# Patient Record
Sex: Female | Born: 1980 | Race: Black or African American | Hispanic: No | Marital: Single | Smoking: Never smoker
Health system: Southern US, Community
[De-identification: ages and names within clinical notes are randomized; demographics above are authoritative.]

## PROBLEM LIST (undated history)

## (undated) ENCOUNTER — Inpatient Hospital Stay (HOSPITAL_COMMUNITY): Payer: Self-pay

## (undated) DIAGNOSIS — Z9049 Acquired absence of other specified parts of digestive tract: Secondary | ICD-10-CM

## (undated) HISTORY — DX: Acquired absence of other specified parts of digestive tract: Z90.49

## (undated) HISTORY — PX: APPENDECTOMY: SHX54

---

## 2017-07-30 ENCOUNTER — Emergency Department (HOSPITAL_COMMUNITY)
Admission: EM | Admit: 2017-07-30 | Discharge: 2017-07-30 | Disposition: A | Discharge status: Home / Self Care | Payer: Self-pay | Attending: Emergency Medicine | Admitting: Emergency Medicine

## 2017-07-30 ENCOUNTER — Other Ambulatory Visit: Payer: Self-pay

## 2017-07-30 ENCOUNTER — Emergency Department (HOSPITAL_COMMUNITY): Payer: Self-pay

## 2017-07-30 ENCOUNTER — Encounter (HOSPITAL_COMMUNITY): Payer: Self-pay

## 2017-07-30 DIAGNOSIS — O209 Hemorrhage in early pregnancy, unspecified: Secondary | ICD-10-CM | POA: Insufficient documentation

## 2017-07-30 DIAGNOSIS — O26891 Other specified pregnancy related conditions, first trimester: Secondary | ICD-10-CM | POA: Insufficient documentation

## 2017-07-30 DIAGNOSIS — N939 Abnormal uterine and vaginal bleeding, unspecified: Secondary | ICD-10-CM

## 2017-07-30 DIAGNOSIS — R109 Unspecified abdominal pain: Secondary | ICD-10-CM | POA: Insufficient documentation

## 2017-07-30 DIAGNOSIS — Z3490 Encounter for supervision of normal pregnancy, unspecified, unspecified trimester: Secondary | ICD-10-CM

## 2017-07-30 DIAGNOSIS — Z3A01 Less than 8 weeks gestation of pregnancy: Secondary | ICD-10-CM | POA: Insufficient documentation

## 2017-07-30 LAB — WET PREP, GENITAL
Clue Cells Wet Prep HPF POC: NONE SEEN
SPERM: NONE SEEN
Trich, Wet Prep: NONE SEEN
Yeast Wet Prep HPF POC: NONE SEEN

## 2017-07-30 LAB — COMPREHENSIVE METABOLIC PANEL
ALBUMIN: 4 g/dL (ref 3.5–5.0)
ALK PHOS: 52 U/L (ref 38–126)
ALT: 10 U/L — AB (ref 14–54)
ANION GAP: 6 (ref 5–15)
AST: 21 U/L (ref 15–41)
BUN: 8 mg/dL (ref 6–20)
CALCIUM: 9.5 mg/dL (ref 8.9–10.3)
CHLORIDE: 107 mmol/L (ref 101–111)
CO2: 23 mmol/L (ref 22–32)
CREATININE: 0.95 mg/dL (ref 0.44–1.00)
GFR calc non Af Amer: >60 mL/min (ref >60)
GLUCOSE: 94 mg/dL (ref 65–99)
Potassium: 4 mmol/L (ref 3.5–5.1)
SODIUM: 136 mmol/L (ref 135–145)
Total Bilirubin: 1.4 mg/dL — ABNORMAL HIGH (ref 0.3–1.2)
Total Protein: 7.3 g/dL (ref 6.5–8.1)

## 2017-07-30 LAB — TYPE AND SCREEN
ABO/RH(D): O POS
ANTIBODY SCREEN: NEGATIVE

## 2017-07-30 LAB — CBC
HCT: 36.5 % (ref 36.0–46.0)
Hemoglobin: 12.1 g/dL (ref 12.0–15.0)
MCH: 25.1 pg — ABNORMAL LOW (ref 26.0–34.0)
MCHC: 33.2 g/dL (ref 30.0–36.0)
MCV: 75.6 fL — ABNORMAL LOW (ref 78.0–100.0)
PLATELETS: 189 10*3/uL (ref 150–400)
RBC: 4.83 MIL/uL (ref 3.87–5.11)
RDW: 16.5 % — ABNORMAL HIGH (ref 11.5–15.5)
WBC: 4.1 10*3/uL (ref 4.0–10.5)

## 2017-07-30 LAB — I-STAT BETA HCG BLOOD, ED (MC, WL, AP ONLY)

## 2017-07-30 LAB — LIPASE, BLOOD: LIPASE: 21 U/L (ref 11–51)

## 2017-07-30 LAB — HCG, QUANTITATIVE, PREGNANCY: hCG, Beta Chain, Quant, S: 9409 m[IU]/mL — ABNORMAL HIGH (ref <5)

## 2017-07-30 NOTE — ED Notes (Signed)
Pt back from US

## 2017-07-30 NOTE — ED Notes (Signed)
Patient transported to Ultrasound 

## 2017-07-30 NOTE — ED Provider Notes (Signed)
[facility] Provider Note   CSN: [id number] Arrival date & time: 07/30/17  1140     History   Chief Complaint Chief Complaint  Patient presents with  . Abdominal Pain    HPI Amber Krause is a 36 y.o. female.  36 year old female presents with complaint of left flank discomfort and mild vaginal spotting.  She reports 2 prior pregnancies (both live births at term).  She reports a prior appendectomy.  She reports that her symptoms started over the last 2 days.  Her reported last menstrual period was at the end of October. She denies prior ectopic or STD. No prior reported miscarriage.  She denies urinary changes or BM changes.  She appears comfortable during evaluation.    The history is provided by the patient.  Vaginal Bleeding  Primary symptoms include vaginal bleeding. There has been no fever. This is a new problem. The current episode started 2 days ago. The problem occurs rarely. The problem has not changed since onset.She is pregnant. She has missed her period. Her LMP was months ago. The patient's menstrual history has been irregular. The discharge was normal. Associated symptoms include abdominal pain. The treatment provided no relief. Sexual activity: sexually active.    History reviewed. No pertinent past medical history.  There are no active problems to display for this patient.   History reviewed. No pertinent surgical history.  OB History    No data available       Home Medications    Prior to Admission medications   Not on File    Family History History reviewed. No pertinent family history.  Social History Social History   Tobacco Use  . Smoking status: Never Smoker  . Smokeless tobacco: Never Used  Substance Use Topics  . Alcohol use: No    Frequency: Never  . Drug use: Not on file     Allergies   Patient has no known allergies.   Review of Systems Review of Systems  Gastrointestinal:  Positive for abdominal pain.  Genitourinary: Positive for vaginal bleeding.  All other systems reviewed and are negative.    Physical Exam Updated Vital Signs BP 114/68 (BP Location: Right Arm)   Pulse 64   Temp 97.9 F (36.6 C) (Oral)   Resp 20   LMP 05/30/2017 (Within Days)   SpO2 100%   Physical Exam  Constitutional: She is oriented to person, place, and time. She appears well-developed and well-nourished. No distress.  HENT:  Head: Normocephalic and atraumatic.  Mouth/Throat: Oropharynx is clear and moist.  Eyes: Conjunctivae and EOM are normal. Pupils are equal, round, and reactive to light.  Neck: Normal range of motion. Neck supple.  Cardiovascular: Normal rate, regular rhythm and normal heart sounds.  Pulmonary/Chest: Effort normal and breath sounds normal. No respiratory distress.  Abdominal: Soft. Bowel sounds are normal. She exhibits no distension. There is no tenderness.  Musculoskeletal: Normal range of motion. She exhibits no edema or deformity.  Neurological: She is alert and oriented to person, place, and time.  Skin: Skin is warm and dry.  Psychiatric: She has a normal mood and affect.  Nursing note and vitals reviewed.    ED Treatments / Results  Labs (all labs ordered are listed, but only abnormal results are displayed) Labs Reviewed  COMPREHENSIVE METABOLIC PANEL - Abnormal; Notable for the following components:      Result Value   ALT 10 (*)    Total Bilirubin 1.4 (*)  All other components within normal limits  CBC - Abnormal; Notable for the following components:   MCV 75.6 (*)    MCH 25.1 (*)    RDW 16.5 (*)    All other components within normal limits  I-STAT BETA HCG BLOOD, ED ([facility], [facility], [facility] ONLY) - Abnormal; Notable for the following components:   I-stat hCG, quantitative >2,000.0 (*)    All other components within normal limits  LIPASE, BLOOD  URINALYSIS, ROUTINE W REFLEX MICROSCOPIC  HCG, QUANTITATIVE, PREGNANCY  TYPE AND SCREEN     EKG  EKG Interpretation None       Radiology No results found.  Procedures Procedures (including critical care time)  Medications Ordered in ED Medications - No data to display   Initial Impression / Assessment and Plan / ED Course  I have reviewed the triage vital signs and the nursing notes.  Pertinent labs & imaging results that were available during my care of the patient were reviewed by me and considered in my medical decision making (see chart for details).    2020  Case discussed with Dr. Macon LargeAnyanwu - OBGYN - She agrees with ED management and [facility]    MSE complete   Patient's presentation is most consistent with likely early miscarriage.  Patient is comfortable following ED workup and evaluation.  She understands the need for close follow-up with OB/GYN on Thursday morning in 48 hours.  She understands the need for strict return precautions -including increased bleeding, pain, or other emergency.   Final Clinical Impressions(s) / ED Diagnoses   Final diagnoses:  Vaginal bleeding  Pregnancy, unspecified gestational age    ED Discharge Orders    None       Wynetta FinesMessick, Reshard Guillet C, MD 07/30/17 2201

## 2017-07-30 NOTE — ED Triage Notes (Signed)
Pt reports left side abdominal pain X3 days. Denies n/v/d. Pt afebrile.

## 2017-07-30 NOTE — ED Notes (Signed)
Pt set-up for Pelvic exam

## 2017-07-31 LAB — ABO/RH: ABO/RH(D): O POS

## 2017-08-01 ENCOUNTER — Other Ambulatory Visit: Payer: Self-pay | Admitting: *Deleted

## 2017-08-01 DIAGNOSIS — O3680X0 Pregnancy with inconclusive fetal viability, not applicable or unspecified: Secondary | ICD-10-CM

## 2017-08-01 LAB — POCT URINALYSIS DIP (DEVICE)
BILIRUBIN URINE: NEGATIVE
GLUCOSE, UA: NEGATIVE mg/dL
KETONES UR: NEGATIVE mg/dL
LEUKOCYTES UA: NEGATIVE
Nitrite: NEGATIVE
Protein, ur: NEGATIVE mg/dL
SPECIFIC GRAVITY, URINE: 1.025 (ref 1.005–1.030)
Urobilinogen, UA: 1 mg/dL (ref 0.0–1.0)
pH: 6 (ref 5.0–8.0)

## 2017-08-01 LAB — GC/CHLAMYDIA PROBE AMP (~~LOC~~) NOT AT ARMC
Chlamydia: NEGATIVE
Neisseria Gonorrhea: NEGATIVE

## 2017-08-01 LAB — HCG, QUANTITATIVE, PREGNANCY: hCG, Beta Chain, Quant, S: 11586 m[IU]/mL — ABNORMAL HIGH (ref <5)

## 2017-08-01 NOTE — Progress Notes (Signed)
Chart reviewed for nurse visit. Agree with plan of care.   Marylene LandKooistra, Shakiyah Cirilo Lorraine, CNM 08/01/2017 1:09 PM

## 2017-08-01 NOTE — Progress Notes (Signed)
Here for stat hcg. Denies vaginal bleeding. States still having the left flank pain she was having in the ER and states is mild now, not severe.  Urinalysis completed which was negative except for blood.   Reviewed results with Lacey JensenKathryn Koostra, CNM and with patient . Informed her we do not yet know if she is having a miscarriage or a live baby and need to do ultrasound in 10 days. Explained will have US 08/09/17 and then come to us for results. She voices understanding.

## 2017-08-06 ENCOUNTER — Inpatient Hospital Stay (HOSPITAL_COMMUNITY): Payer: Self-pay

## 2017-08-06 ENCOUNTER — Encounter (HOSPITAL_COMMUNITY): Payer: Self-pay | Admitting: *Deleted

## 2017-08-06 ENCOUNTER — Inpatient Hospital Stay (HOSPITAL_COMMUNITY)
Admission: AD | Admit: 2017-08-06 | Discharge: 2017-08-06 | Disposition: A | Discharge status: Home / Self Care | Payer: Self-pay | Source: Ambulatory Visit | Attending: Obstetrics and Gynecology | Admitting: Obstetrics and Gynecology

## 2017-08-06 DIAGNOSIS — O034 Incomplete spontaneous abortion without complication: Secondary | ICD-10-CM

## 2017-08-06 DIAGNOSIS — O039 Complete or unspecified spontaneous abortion without complication: Secondary | ICD-10-CM | POA: Insufficient documentation

## 2017-08-06 DIAGNOSIS — Z3A09 9 weeks gestation of pregnancy: Secondary | ICD-10-CM | POA: Insufficient documentation

## 2017-08-06 LAB — URINALYSIS, ROUTINE W REFLEX MICROSCOPIC
Bilirubin Urine: NEGATIVE
GLUCOSE, UA: NEGATIVE mg/dL
Ketones, ur: NEGATIVE mg/dL
Leukocytes, UA: NEGATIVE
Nitrite: NEGATIVE
PROTEIN: NEGATIVE mg/dL
Specific Gravity, Urine: 1.018 (ref 1.005–1.030)
pH: 5 (ref 5.0–8.0)

## 2017-08-06 LAB — HCG, QUANTITATIVE, PREGNANCY: HCG, BETA CHAIN, QUANT, S: 10511 m[IU]/mL — AB (ref <5)

## 2017-08-06 MED ORDER — OXYCODONE-ACETAMINOPHEN 5-325 MG PO TABS: 2.0000 | ORAL_TABLET | ORAL | 0 refills | Status: DC | PRN

## 2017-08-06 NOTE — Discharge Instructions (Signed)

## 2017-08-06 NOTE — MAU Provider Note (Cosign Needed)
  History     CSN: [id number]  Arrival date and time: 08/06/17 0607   None     Chief Complaint  Patient presents with  . Abdominal Pain  . Vaginal Bleeding   36 yo G3P2002 at 4994w5d gestation presents with vaginal bleeding that is moderate per patient. She's had vaginal bleeding since 07/30/17 for which she was seen in ED. At that time, hcg was 9,000 and US was inconclusive. Patient says bleeding has worsened over the past couple days. Denies fever, sweats, chills.     No past medical history on file.  No past surgical history on file.  No family history on file.  Social History   Tobacco Use  . Smoking status: Never Smoker  . Smokeless tobacco: Never Used  Substance Use Topics  . Alcohol use: No    Frequency: Never  . Drug use: Not on file    Allergies: No Known Allergies  No medications prior to admission.    Review of Systems  Constitutional: Negative for activity change and appetite change.  HENT: Negative for congestion and dental problem.   Eyes: Negative for discharge and itching.  Respiratory: Negative for apnea and chest tightness.   Cardiovascular: Negative for chest pain and leg swelling.  Gastrointestinal: Positive for abdominal pain. Negative for vomiting.  Endocrine: Negative for cold intolerance and heat intolerance.  Genitourinary: Positive for vaginal bleeding. Negative for dysuria.  Musculoskeletal: Negative for arthralgias and back pain.  Neurological: Negative for dizziness and light-headedness.   Physical Exam   Blood pressure (!) 106/52, pulse 75, temperature 98.7 F (37.1 C), temperature source Oral, resp. rate 17, height 5\' 4"  (1.626 m), weight 125 lb 8 oz (56.9 kg), last menstrual period 05/30/2017, SpO2 100 %.  Physical Exam  Constitutional: She is oriented to person, place, and time. She appears well-developed and well-nourished.  HENT:  Head: Normocephalic and atraumatic.  Eyes: Conjunctivae are normal. Pupils are equal, round,  and reactive to light.  Neck: Normal range of motion. Neck supple.  Cardiovascular: Normal rate and intact distal pulses.  Respiratory: Effort normal. No respiratory distress.  GI: Soft. There is no tenderness.  Genitourinary:  Genitourinary Comments: Sterile speculum: cervical os is visibly open and there is a blood clot vs products of conception at opening of os.   Musculoskeletal: Normal range of motion. She exhibits no edema.  Neurological: She is alert and oriented to person, place, and time.  Skin: Skin is warm and dry.  Psychiatric: She has a normal mood and affect. Her behavior is normal.    MAU Course  Procedures  MDM Hcg is now 10,000, down from 11,000. US today shows no yolk sac.   Assessment and Plan  1. Inevitable spontaneous abortion- open cervical os. conservative management. Counseled patient extensively on what to expect. Prescribed pain medication. Follow up in clinic in 1 week. Keep US appointment and return in 2 days for final hcg.   Chubb Corporationmber Cady Hafen 08/06/2017, 7:59 AM

## 2017-08-06 NOTE — MAU Note (Signed)
Pt presents to mau with abd pain and bleeding that started Sunday night. Pt thinks she is having a miscarriage.

## 2017-08-08 ENCOUNTER — Other Ambulatory Visit: Payer: Self-pay | Admitting: Student

## 2017-08-08 DIAGNOSIS — O3680X0 Pregnancy with inconclusive fetal viability, not applicable or unspecified: Secondary | ICD-10-CM

## 2017-08-09 ENCOUNTER — Ambulatory Visit: Payer: Self-pay | Admitting: General Practice

## 2017-08-09 ENCOUNTER — Ambulatory Visit (HOSPITAL_COMMUNITY)
Admission: RE | Admit: 2017-08-09 | Discharge: 2017-08-09 | Disposition: A | Discharge status: Home / Self Care | Payer: Self-pay | Source: Ambulatory Visit | Attending: Student | Admitting: Student

## 2017-08-09 DIAGNOSIS — O3680X0 Pregnancy with inconclusive fetal viability, not applicable or unspecified: Secondary | ICD-10-CM

## 2017-08-09 DIAGNOSIS — Z3490 Encounter for supervision of normal pregnancy, unspecified, unspecified trimester: Secondary | ICD-10-CM | POA: Insufficient documentation

## 2017-08-09 DIAGNOSIS — R9389 Abnormal findings on diagnostic imaging of other specified body structures: Secondary | ICD-10-CM | POA: Insufficient documentation

## 2017-08-09 LAB — HCG, QUANTITATIVE, PREGNANCY: hCG, Beta Chain, Quant, S: 2145 m[IU]/mL — ABNORMAL HIGH (ref <5)

## 2017-08-09 NOTE — Progress Notes (Signed)
I agree with the plan of care as stated above   Brandalyn Harting, Harolyn RutherfordJennifer I, NP  08/09/2017 3:59 PM

## 2017-08-09 NOTE — Progress Notes (Signed)
Patient here for viability results & stat bhcg today. Patient reports some cramping rated at a 5 and continued bleeding like a period.  Reviewed all results with Venia CarbonJennifer Rasch who finds decreasing bhcg levels and ultrasound favorable for SAB. Patient should have follow up bhcg in 1 week with provider visit in 2 weeks. Informed patient of all results & recommendations. Patient verbalized understanding to all & states she is going out of town Sunday and will return on Monday the 7th. Told patient to come that day for labs. Patient verbalized understanding to all & had no questions

## 2017-08-26 ENCOUNTER — Other Ambulatory Visit: Payer: Self-pay

## 2017-08-28 ENCOUNTER — Encounter: Payer: Self-pay | Admitting: Student

## 2017-09-09 ENCOUNTER — Ambulatory Visit (INDEPENDENT_AMBULATORY_CARE_PROVIDER_SITE_OTHER): Payer: Self-pay | Admitting: Family Medicine

## 2017-09-09 ENCOUNTER — Encounter: Payer: Self-pay | Admitting: Family Medicine

## 2017-09-09 DIAGNOSIS — O039 Complete or unspecified spontaneous abortion without complication: Secondary | ICD-10-CM

## 2017-09-09 DIAGNOSIS — Z789 Other specified health status: Secondary | ICD-10-CM

## 2017-09-09 MED ORDER — PRENATAL VITAMINS 0.8 MG PO TABS
1.0000 | ORAL_TABLET | Freq: Every day | ORAL | 12 refills | Status: AC
Start: 2017-09-09 — End: ?

## 2017-09-09 NOTE — Progress Notes (Signed)
[place] interpreter Theron Aristaeter  [phone]

## 2017-09-09 NOTE — Progress Notes (Signed)
   Subjective:    Patient ID: Amber Krause, female    DOB: December 07, 1980, 37 y.o.   MRN: [id number]  HPI Patient had miscarriage last month and is being seen for follow up. Had menses yesterday. No cramping, spotting. Looking at becoming pregnant soon.   Review of Systems     Objective:   Physical Exam  Constitutional: She is oriented to person, place, and time. She appears well-developed and well-nourished.  HENT:  Head: Normocephalic and atraumatic.  Cardiovascular: Normal rate, regular rhythm and normal heart sounds.  Pulmonary/Chest: Effort normal.  Abdominal: Soft. There is no tenderness.  Neurological: She is alert and oriented to person, place, and time.  Skin: Skin is warm and dry.  Psychiatric: She has a normal mood and affect. Her behavior is normal. Judgment and thought content normal.      Assessment & Plan:  1. SAB (spontaneous abortion) PNV, f/u as needed. No further labs needed.   2. Language Barrier [place] speaking. Interpreter used.

## 2018-02-10 IMAGING — US US OB TRANSVAGINAL
1 series · 15 of 28 positions shown · non-contrast
Comparison: No prior.

CLINICAL DATA: Pregnancy.  Bleeding.

EXAM:
OBSTETRIC <14 WK US AND TRANSVAGINAL OB US
TECHNIQUE: Both transabdominal and transvaginal ultrasound examinations were
performed for complete evaluation of the gestation as well as the
maternal uterus, adnexal regions, and pelvic cul-de-sac.
Transvaginal technique was performed to assess early pregnancy.

[Series 1: us ob transvaginal · 15 of 31 slices shown]
[im 1/31]
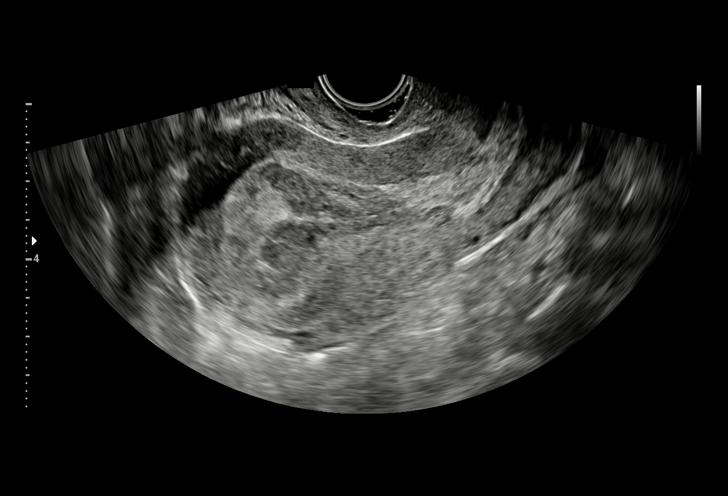
[im 3/31]
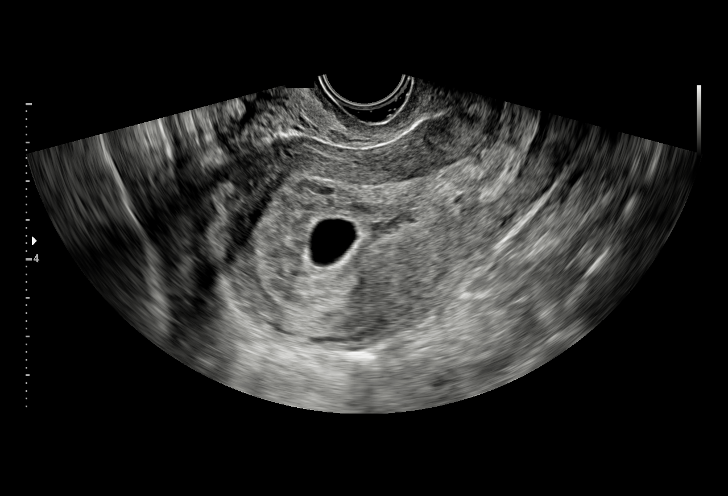
[im 5/31]
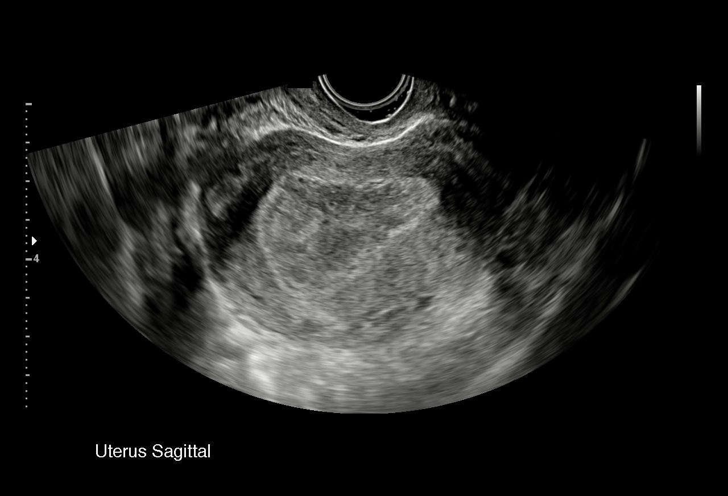
[im 7/31]
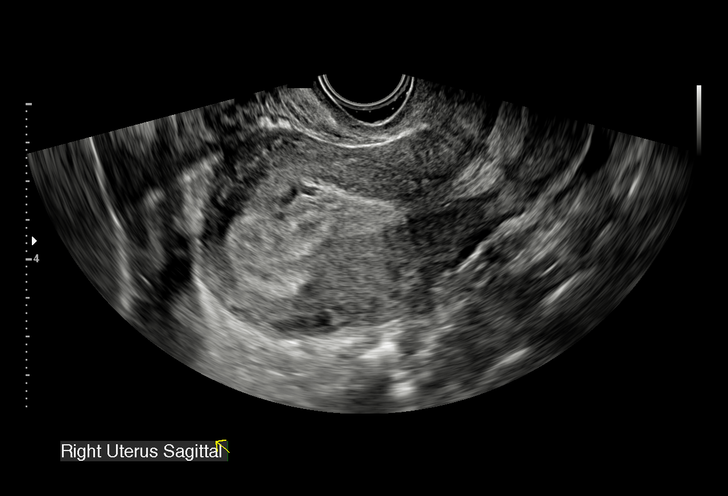
[im 9/31]
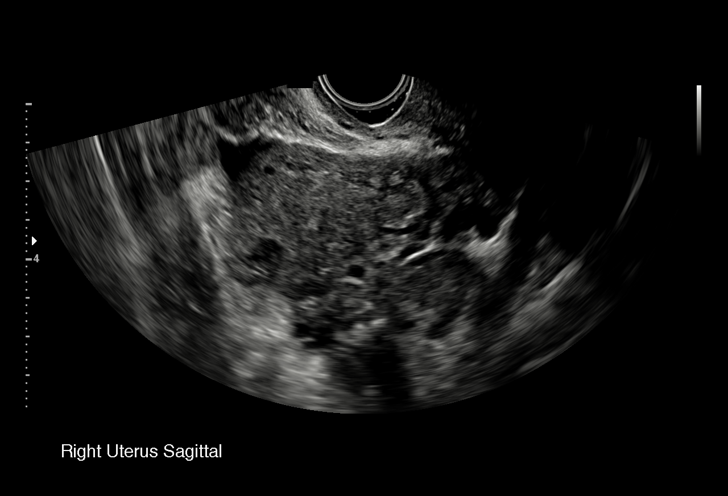
[im 12/31]
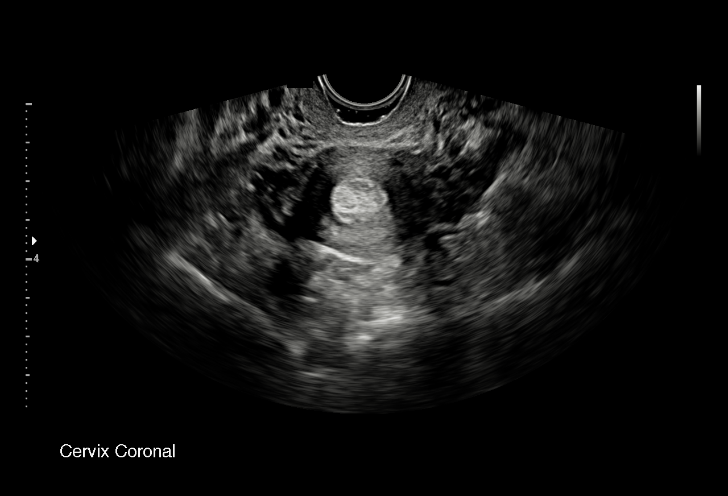
[im 14/31]
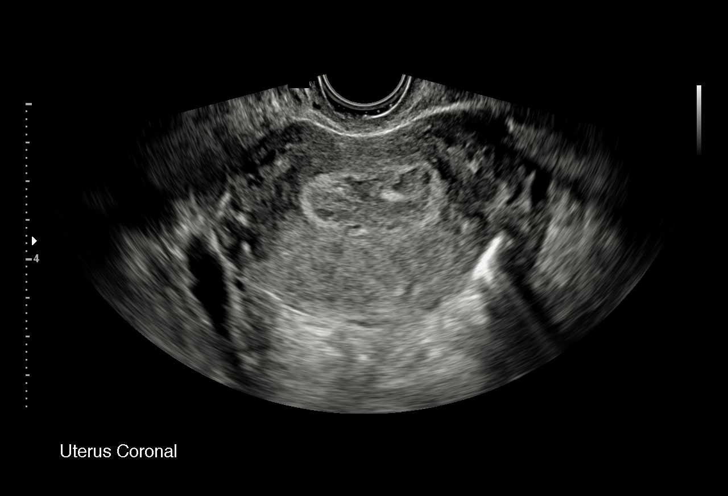
[im 16/31]
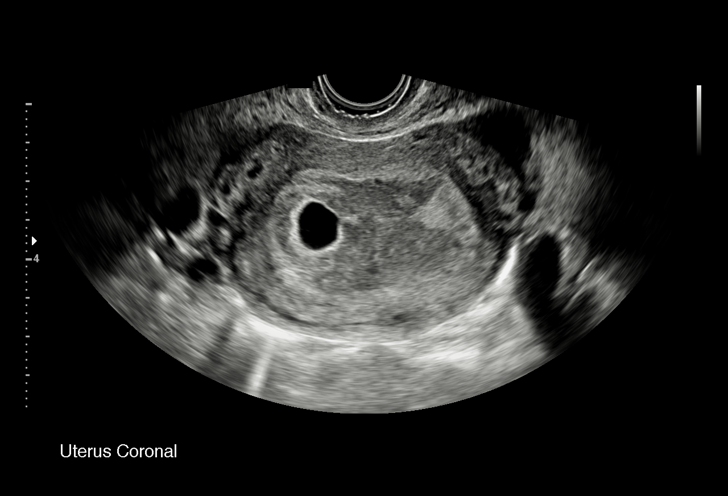
[im 17/31]
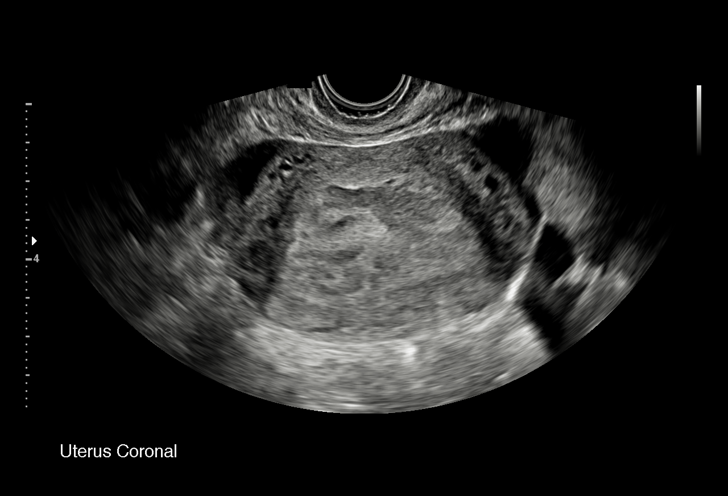
[im 19/31]
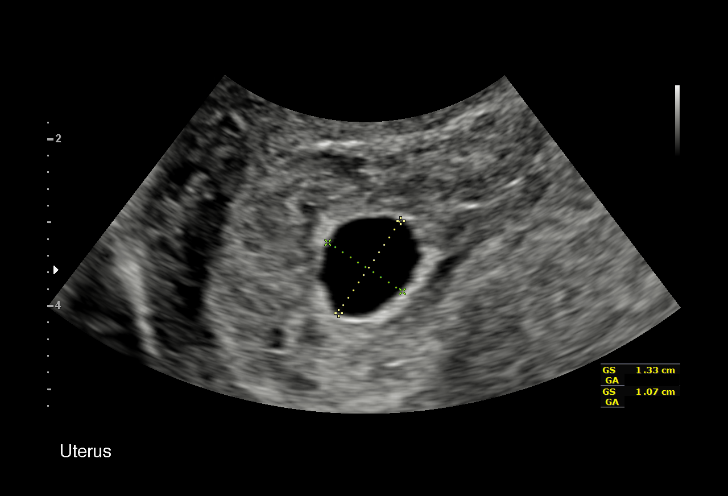
[im 22/31]
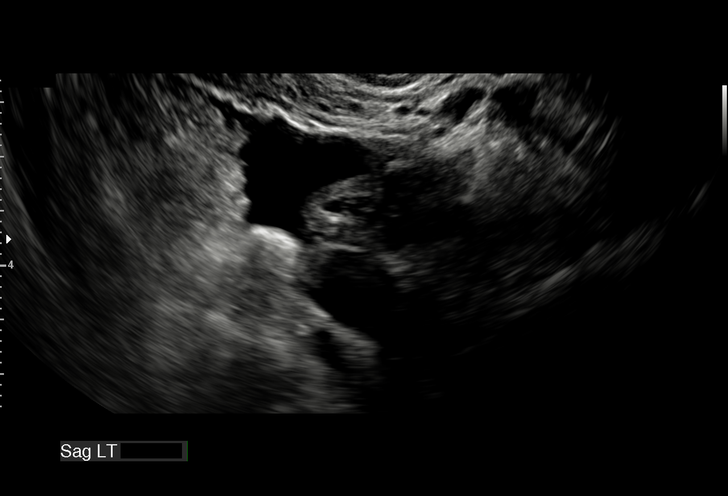
[im 24/31]
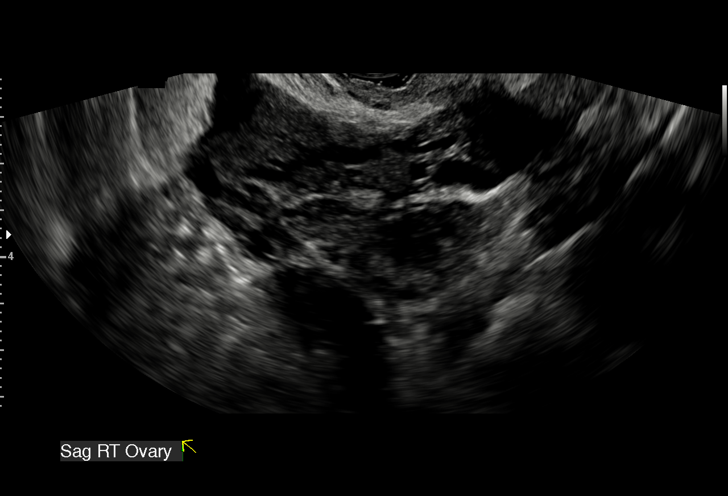
[im 26/31]
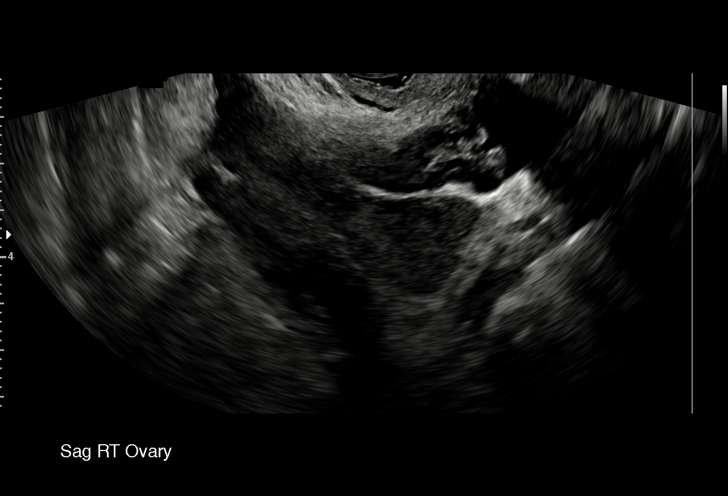
[im 28/31]
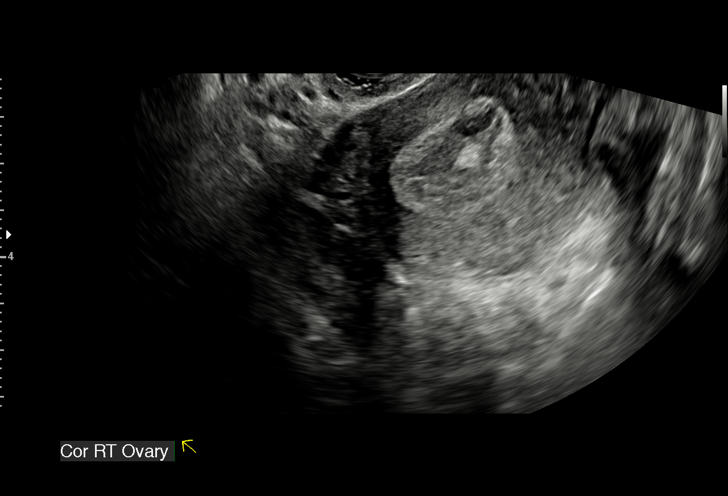
[im 31/31]
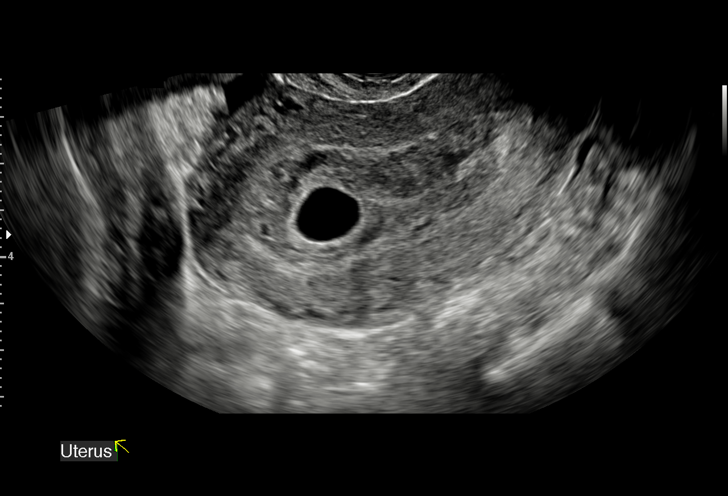

[15 of 28 positions shown; findings below may reference images not displayed]

FINDINGS: Intrauterine gestational sac: Single

Yolk sac:  Not visualized

Embryo:  Not visualized

MSD: 1.2  mm   5 w   6  d

Subchorionic hemorrhage:  None.

Maternal uterus/adnexae: Thickened heterogeneous endometrium. Small
amount of free pelvic fluid. Right ovary appears unremarkable. Left
ovary not visualized.
IMPRESSION: Single gestational sac. No yolk sac or fetal pole identified.
Thickening and heterogeneous endometrium. Small amount of pelvic
fluid. An embryonic and ectopic pregnancy cannot be excluded. Early
IUP cannot be excluded. Continued follow-up pregnancy tests and
pelvic ultrasounds recommended.

These results will be called to the ordering clinician or
representative by the Radiologist Assistant, and communication
documented in the PACS or zVision Dashboard.

## 2019-01-29 ENCOUNTER — Other Ambulatory Visit: Payer: Self-pay | Admitting: Family Medicine

## 2019-01-29 DIAGNOSIS — O0991 Supervision of high risk pregnancy, unspecified, first trimester: Secondary | ICD-10-CM

## 2019-02-26 ENCOUNTER — Other Ambulatory Visit: Payer: Self-pay

## 2019-02-26 ENCOUNTER — Ambulatory Visit
Admission: RE | Admit: 2019-02-26 | Discharge: 2019-02-26 | Disposition: A | Discharge status: Home / Self Care | Payer: Self-pay | Source: Ambulatory Visit | Attending: Family Medicine | Admitting: Family Medicine

## 2019-02-26 DIAGNOSIS — O0991 Supervision of high risk pregnancy, unspecified, first trimester: Secondary | ICD-10-CM

## 2019-09-02 IMAGING — US OBSTETRIC 14+ WK ULTRASOUND
1 of 2 series · 13 of 28 positions shown · non-contrast
Comparison: none

CLINICAL DATA: Second trimester pregnancy. Evaluate dating and
fetal anatomic evaluation.

EXAM:
OBSTETRICAL ULTRASOUND >14 WKS

[Series 1: obstetric 14+ wk ultrasound · 0.22mm/px · 13 of 70 slices shown]
[im 3/70]
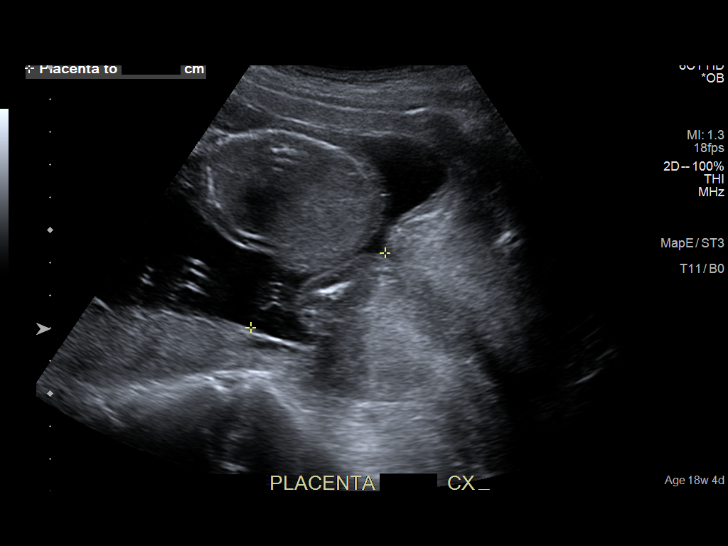
[im 8/70]
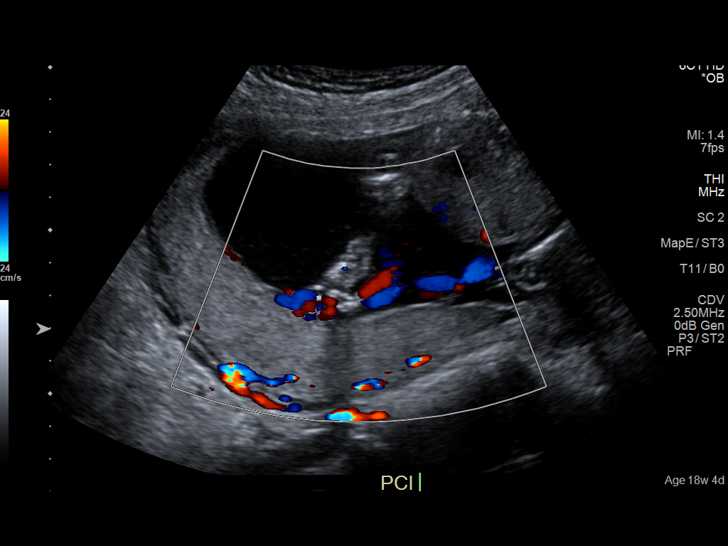
[im 14/70]
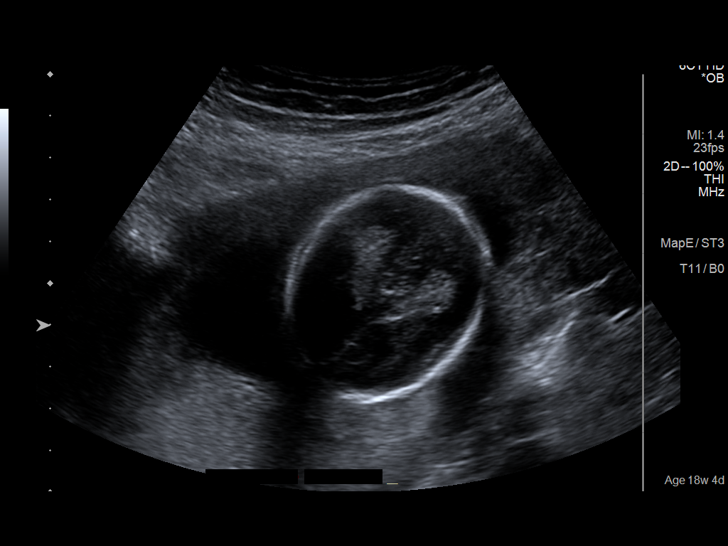
[im 19/70]
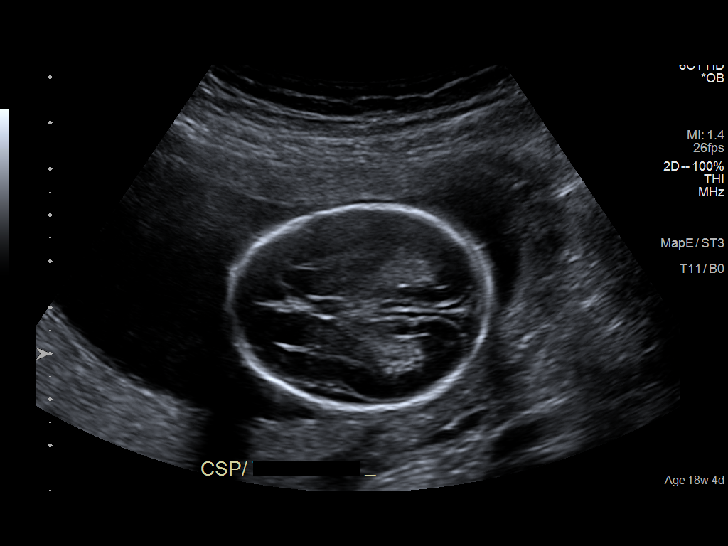
[im 24/70]
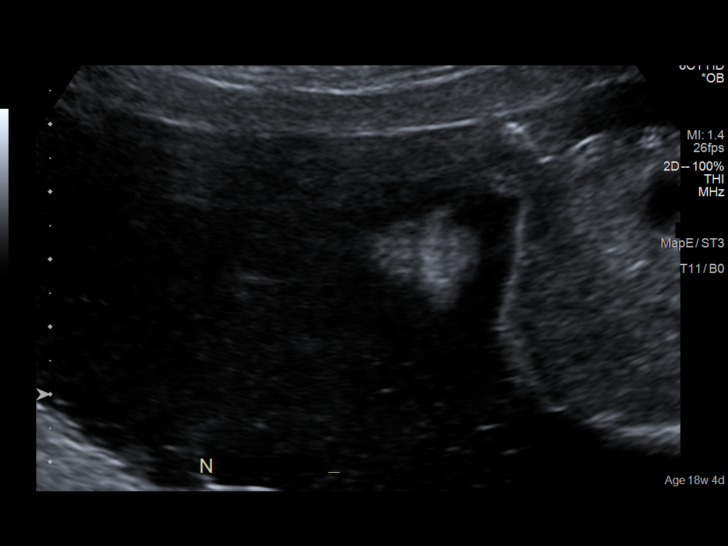
[im 30/70]
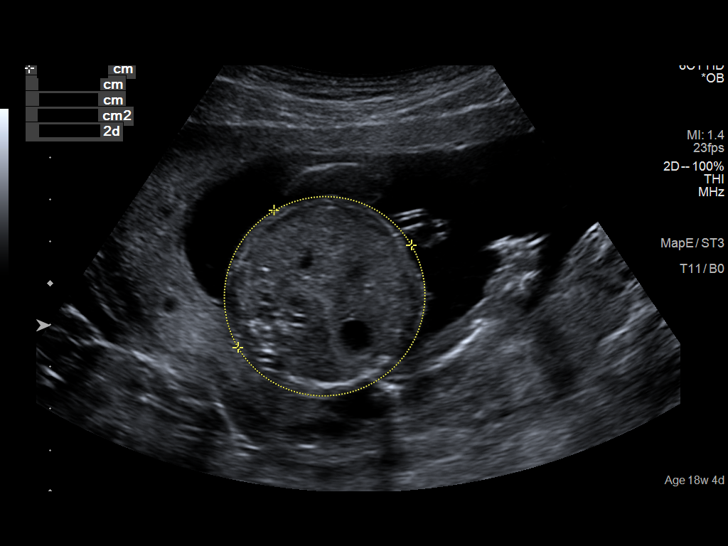
[im 38/70]
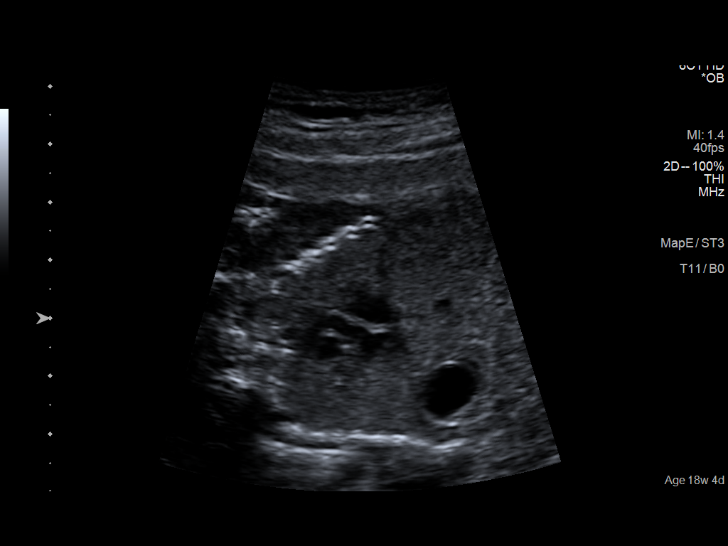
[im 43/70]
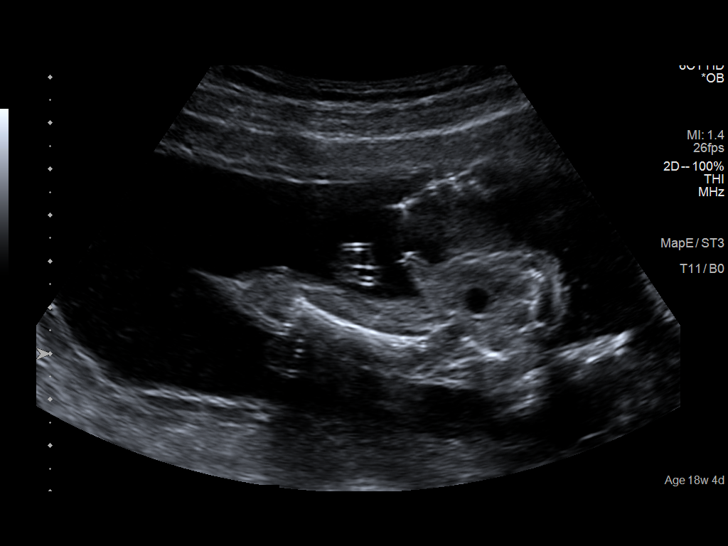
[im 48/70]
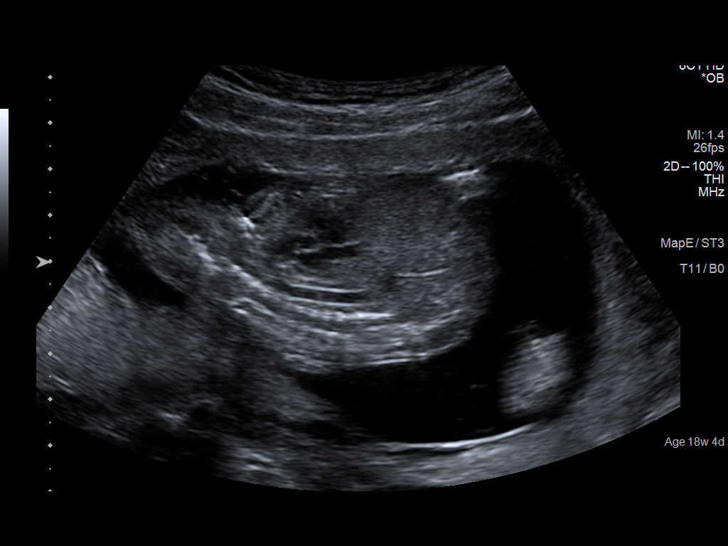
[im 54/70]
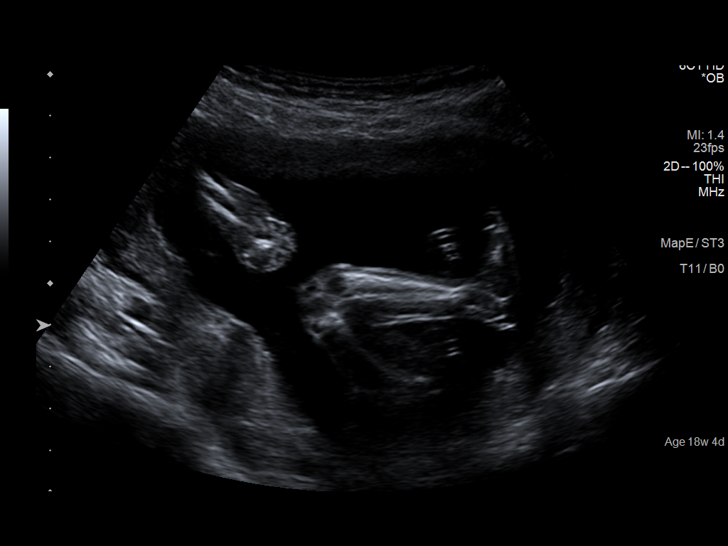
[im 59/70]
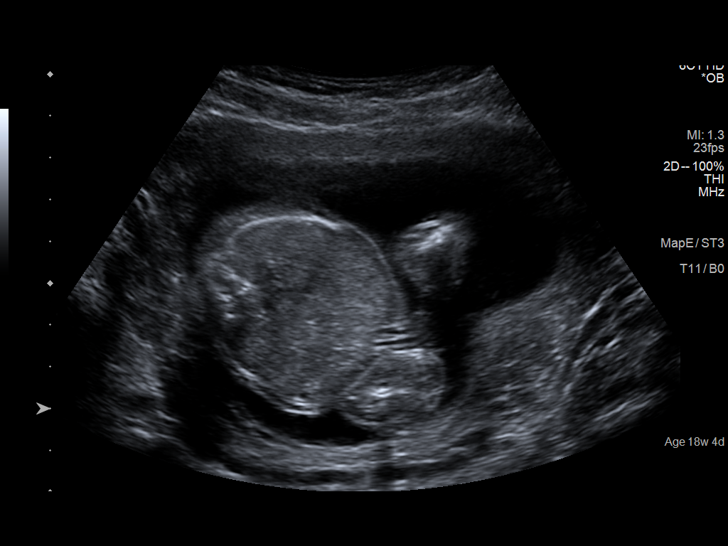
[im 64/70]
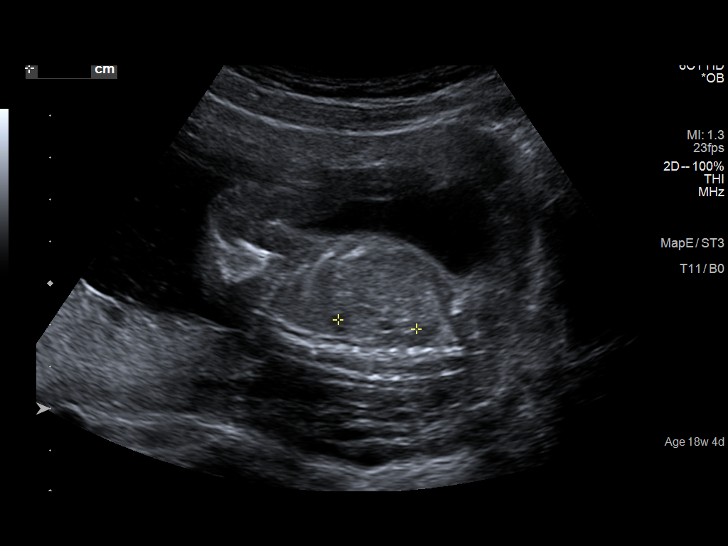
[im 70/70]
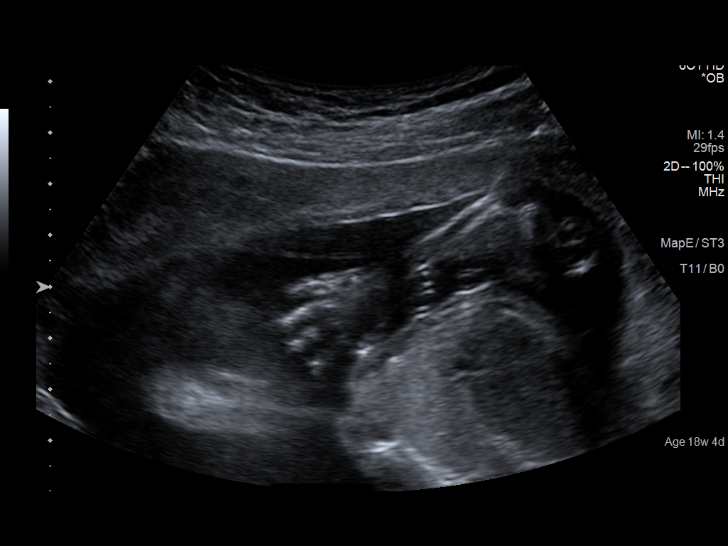

[13 of 28 positions shown; findings below may reference images not displayed]

FINDINGS: Number of Fetuses: 1

Heart Rate:  145 bpm

Movement: Yes

Presentation: Breech

Previa: No

Placental Location: Posterior and right lateral

Amniotic Fluid (Subjective): Within normal limits

Amniotic Fluid (Objective):

Vertical pocket = 4.4cm

FETAL BIOMETRY

BPD: 4.4cm 19w 2d

HC:   17.0cm 19w 4d

AC:   15.1cm 20w 3d

FL:   2.9cm 19w 0d

Current Mean GA: 19w 4d US EDC: 07/19/2019

FETAL ANATOMY

Lateral Ventricles: Appears normal

Thalami/CSP: Appears normal

Posterior Fossa:  Appears normal

Nuchal Region: Appears normal   NFT= 4 mm

Upper Lip: Appears normal

Spine: Not visualized

4 Chamber Heart on Left: Appears normal

LVOT: Appears normal

RVOT: Appears normal

Stomach on Left: Appears normal

3 Vessel Cord: Appears normal

Cord Insertion site: Appears normal

Kidneys: Appears normal

Bladder: Appears normal

Extremities: Appears normal

Sex: Male

Technically difficult due to: Fetal position

Maternal Findings:

Cervix:  4.2 cm TA

An anterior uterine fibroid is seen measuring 3.6 cm in maximum
diameter.
IMPRESSION: Single living IUP measuring 19 weeks 4 days, with US EDC of
07/19/2019.

No fetal anomalies identified, however, fetal spine could not be
visualized on this exam. Consider followup ultrasound for further
anatomic evaluation in 3-4 weeks.

3.6 cm anterior uterine fibroid.

## 2022-01-19 ENCOUNTER — Other Ambulatory Visit: Payer: Self-pay

## 2022-01-19 DIAGNOSIS — Z1231 Encounter for screening mammogram for malignant neoplasm of breast: Secondary | ICD-10-CM

## 2022-03-01 ENCOUNTER — Ambulatory Visit
Admission: RE | Admit: 2022-03-01 | Discharge: 2022-03-01 | Disposition: A | Discharge status: Home / Self Care | Payer: No Typology Code available for payment source | Source: Ambulatory Visit | Attending: Obstetrics and Gynecology | Admitting: Obstetrics and Gynecology

## 2022-03-01 ENCOUNTER — Ambulatory Visit: Payer: Self-pay | Admitting: *Deleted

## 2022-03-01 VITALS — BP 114/70 | Wt 134.1 lb

## 2022-03-01 DIAGNOSIS — Z1239 Encounter for other screening for malignant neoplasm of breast: Secondary | ICD-10-CM

## 2022-03-01 DIAGNOSIS — Z1231 Encounter for screening mammogram for malignant neoplasm of breast: Secondary | ICD-10-CM

## 2022-03-01 NOTE — Progress Notes (Signed)
[name] is a 41 y.o. female who presents to [facility] clinic today with no complaints.    Pap Smear: Pap smear not completed today. Last Pap smear was in May or June 2022 at [facility] clinic and was normal per patient. Per patient has no history of an abnormal Pap smear. Last Pap smear result is not available in Epic. Previous Pap smear result from 12/18/2018 is available in Epic.   Physical exam: Breasts Breasts symmetrical. No skin abnormalities bilateral breasts. No nipple retraction bilateral breasts. No nipple discharge bilateral breasts. No lymphadenopathy. No lumps palpated bilateral breasts. No complaints of pain or tenderness on exam.       Pelvic/Bimanual Pap is not indicated today per BCCCP guidelines.   Smoking History: Patient has never smoked.   Patient Navigation: Patient education provided. Access to services provided for patient through BCCCP program. [place] interpreter Kadidiatou Noma from [facility] provided.   Breast and Cervical Cancer Risk Assessment: Patient does not have family history of breast cancer, known genetic mutations, or radiation treatment to the chest before age 92. Patient does not have history of cervical dysplasia, immunocompromised, or DES exposure in-utero.  Risk Assessment     Risk Scores       03/01/2022   Last edited by: Narda Rutherford, LPN   5-year risk: 0.5 %   Lifetime risk: 7.4 %            A: BCCCP exam without pap smear No complaints.  P: Referred patient to the [facility] for a screening mammogram on mobile unit. Appointment scheduled Thursday, March 01, 2022 at 1110.  Priscille Heidelberg, RN 03/01/2022 10:45 AM

## 2022-03-01 NOTE — Patient Instructions (Addendum)
Explained breast self awareness with Manuella Ghazi. Patient did not need a Pap smear today due to last Pap smear was in May or June 2022 per patient. Let her know BCCCP will cover Pap smears every 3 years unless has a history of abnormal Pap smears. Referred patient to the [facility] for a screening mammogram on mobile unit. Appointment scheduled Thursday, March 01, 2022 at 1110. Patient aware of appointment and will be there. Let patient know the Breast Center will follow up with her within the next couple weeks with results of mammogram by letter or phone. Tiffane Sheldon verbalized understanding.  Swati Granberry, Kathaleen Maser, RN 10:45 AM

## 2022-03-05 ENCOUNTER — Other Ambulatory Visit: Payer: Self-pay | Admitting: Obstetrics and Gynecology

## 2022-03-05 DIAGNOSIS — R928 Other abnormal and inconclusive findings on diagnostic imaging of breast: Secondary | ICD-10-CM

## 2022-03-13 ENCOUNTER — Other Ambulatory Visit: Payer: No Typology Code available for payment source

## 2022-03-20 ENCOUNTER — Ambulatory Visit
Admission: RE | Admit: 2022-03-20 | Discharge: 2022-03-20 | Disposition: A | Discharge status: Home / Self Care | Payer: No Typology Code available for payment source | Source: Ambulatory Visit | Attending: Obstetrics and Gynecology | Admitting: Obstetrics and Gynecology

## 2022-03-20 DIAGNOSIS — R928 Other abnormal and inconclusive findings on diagnostic imaging of breast: Secondary | ICD-10-CM

## 2022-04-29 ENCOUNTER — Ambulatory Visit (HOSPITAL_COMMUNITY)
Admission: EM | Admit: 2022-04-29 | Discharge: 2022-04-29 | Disposition: A | Discharge status: Home / Self Care | Payer: No Typology Code available for payment source | Attending: Family Medicine | Admitting: Family Medicine

## 2022-04-29 ENCOUNTER — Ambulatory Visit (INDEPENDENT_AMBULATORY_CARE_PROVIDER_SITE_OTHER): Payer: No Typology Code available for payment source

## 2022-04-29 ENCOUNTER — Encounter (HOSPITAL_COMMUNITY): Payer: Self-pay | Admitting: *Deleted

## 2022-04-29 DIAGNOSIS — R10A2 Flank pain, left side: Secondary | ICD-10-CM

## 2022-04-29 DIAGNOSIS — Z3202 Encounter for pregnancy test, result negative: Secondary | ICD-10-CM

## 2022-04-29 DIAGNOSIS — R109 Unspecified abdominal pain: Secondary | ICD-10-CM

## 2022-04-29 LAB — POCT URINALYSIS DIPSTICK, ED / UC
Bilirubin Urine: NEGATIVE
Glucose, UA: NEGATIVE mg/dL
Hgb urine dipstick: NEGATIVE
Ketones, ur: NEGATIVE mg/dL
Leukocytes,Ua: NEGATIVE
Nitrite: NEGATIVE
Protein, ur: NEGATIVE mg/dL
Specific Gravity, Urine: 1.02 (ref 1.005–1.030)
Urobilinogen, UA: 0.2 mg/dL (ref 0.0–1.0)
pH: 5.5 (ref 5.0–8.0)

## 2022-04-29 LAB — POC URINE PREG, ED: Preg Test, Ur: NEGATIVE

## 2022-04-29 MED ORDER — KETOROLAC TROMETHAMINE 30 MG/ML IJ SOLN
INTRAMUSCULAR | Status: AC
  Filled 2022-04-29: qty 1

## 2022-04-29 MED ORDER — KETOROLAC TROMETHAMINE 30 MG/ML IJ SOLN
30.0000 mg | Freq: Once | INTRAMUSCULAR | Status: AC
  Administered 2022-04-29: 30 mg via INTRAMUSCULAR

## 2022-04-29 NOTE — ED Provider Notes (Signed)
[facility]    CSN: [id number] Arrival date & time: 04/29/22  1010      History   Chief Complaint Chief Complaint  Patient presents with   Flank Pain    HPI Amber Krause is a 41 y.o. female.    Flank Pain   Here with left side/flank pain radiating into her left lower abdomen and her left upper leg.  It began last evening and has not let up.  She had similar pain about 3 weeks ago that lasted about an hour to.  She had the same kind of pain about 3 to 4 days later, and that episode also resolved spontaneously within an hour or 2. No associated fever or chills.  No cough or cold or congestion symptoms.  No nausea or vomiting or diarrhea.  No hematuria and no dysuria.  Menstrual cycle was about August 28.  She has not noticed any eliciting or relieving factors for this pain.  It does not seem to affect it and eating does not affect it  History reviewed. No pertinent past medical history.  There are no problems to display for this patient.   Past Surgical History:  Procedure Laterality Date   APPENDECTOMY      OB History     Gravida  4   Para  2   Term  2   Preterm      AB  1   Living  2      SAB  1   IAB      Ectopic      Multiple      Live Births  2            Home Medications    Prior to Admission medications   Medication Sig Start Date End Date Taking? Authorizing Provider  levothyroxine (SYNTHROID) 125 MCG tablet Take by mouth. 12/25/18  Yes [provider]  Prenatal Multivit-Min-Fe-FA (PRENATAL VITAMINS) 0.8 MG tablet Take 1 tablet by mouth daily. 09/09/17   Levie Heritage, DO    Family History Family History  Problem Relation Age of Onset   Breast cancer Neg Hx     Social History Social History   Tobacco Use   Smoking status: Never   Smokeless tobacco: Never  Vaping Use   Vaping Use: Never used  Substance Use Topics   Alcohol use: No   Drug use: Never     Allergies   Patient has no known  allergies.   Review of Systems Review of Systems  Genitourinary:  Positive for flank pain.     Physical Exam Triage Vital Signs ED Triage Vitals  Enc Vitals Group     BP 04/29/22 1032 124/77     Pulse Rate 04/29/22 1032 72     Resp 04/29/22 1032 18     Temp 04/29/22 1032 99.4 F (37.4 C)     Temp Source 04/29/22 1032 Oral     SpO2 04/29/22 1032 99 %     Weight --      Height --      Head Circumference --      Peak Flow --      Pain Score 04/29/22 1029 6     Pain Loc --      Pain Edu? --      Excl. in GC? --    No data found.  Updated Vital Signs BP 124/77 (BP Location: Left Arm)   Pulse 72   Temp 99.4 F (37.4 C) (  Oral)   Resp 18   LMP 04/14/2022 (Approximate)   SpO2 99%   Visual Acuity Right Eye Distance:   Left Eye Distance:   Bilateral Distance:    Right Eye Near:   Left Eye Near:    Bilateral Near:     Physical Exam Vitals reviewed.  Constitutional:      General: She is not in acute distress.    Appearance: She is not toxic-appearing.  HENT:     Mouth/Throat:     Mouth: Mucous membranes are moist.  Eyes:     Extraocular Movements: Extraocular movements intact.     Conjunctiva/sclera: Conjunctivae normal.     Pupils: Pupils are equal, round, and reactive to light.  Cardiovascular:     Rate and Rhythm: Normal rate and regular rhythm.     Heart sounds: No murmur heard. Pulmonary:     Effort: Pulmonary effort is normal.     Breath sounds: Normal breath sounds.  Abdominal:     Palpations: Abdomen is soft.     Comments: There is tenderness in her left upper quadrant and left lower quadrant and left periumbilical area  Musculoskeletal:     Cervical back: Neck supple.     Right lower leg: No edema.     Left lower leg: No edema.  Lymphadenopathy:     Cervical: No cervical adenopathy.  Skin:    Coloration: Skin is not jaundiced or pale.  Neurological:     General: No focal deficit present.     Mental Status: She is alert and oriented to  person, place, and time.  Psychiatric:        Behavior: Behavior normal.      UC Treatments / Results  Labs (all labs ordered are listed, but only abnormal results are displayed) Labs Reviewed  POC URINE PREG, ED  POCT URINALYSIS DIPSTICK, ED / UC    EKG   Radiology DG Abd 1 View  Result Date: 04/29/2022 CLINICAL DATA: Left-sided abdominal pain. EXAM: ABDOMEN - 1 VIEW COMPARISON:  None Available. FINDINGS: The bowel gas pattern is normal. There is a moderate amount of retained stool identified within the right colon as well as the rectum. Mild gaseous distension of the transverse colon and descending colon noted. No dilated small bowel loops noted. No radio-opaque calculi or other significant radiographic abnormality are seen. IMPRESSION: 1. Nonobstructive bowel gas pattern. 2. Moderate amount of retained stool within the right colon and rectum. Electronically Signed   By: Signa Kell M.D.   On: 04/29/2022 11:32    Procedures Procedures (including critical care time)  Medications Ordered in UC Medications  ketorolac (TORADOL) 30 MG/ML injection 30 mg (has no administration in time range)    Initial Impression / Assessment and Plan / UC Course  I have reviewed the triage vital signs and the nursing notes.  Pertinent labs & imaging results that were available during my care of the patient were reviewed by me and considered in my medical decision making (see chart for details).        UA is negative, and UPT is negative.  KUB shows some mildly dilated bowel with a lot of stool in her rectum.  I am going to treat with Toradol, and asked her to use a suppository and MiraLAX.  If worsening she should proceed to the emergency room Final Clinical Impressions(s) / UC Diagnoses   Final diagnoses:  Left flank pain     Discharge Instructions      Pregnancy  test was negative The urinalysis was negative  X-ray showed a good bit of stool down in your lower part of your  colon, and that your colon was a little distended.  This could be causing your pain.  Use a Dulcolax suppository and start using MiraLAX over-the-counter today according to the bottle instructions.  If you worsen in any way please go to the emergency room  You have been given a shot of Toradol 30 mg today.       ED Prescriptions   None    I have reviewed the PDMP during this encounter.   Zenia Resides, MD 04/29/22 1141

## 2022-04-29 NOTE — ED Triage Notes (Signed)
Pt states that she has left sided pain that radiated down her left leg X 3 weeks. It does come and go for a few days at a time. She does also have a headache and some back pain. She hasnt taken any meds for this pain.

## 2022-04-29 NOTE — Discharge Instructions (Addendum)
Pregnancy test was negative The urinalysis was negative  X-ray showed a good bit of stool down in your lower part of your colon, and that your colon was a little distended.  This could be causing your pain.  Use a Dulcolax suppository and start using MiraLAX over-the-counter today according to the bottle instructions.  If you worsen in any way please go to the emergency room  You have been given a shot of Toradol 30 mg today.

## 2022-07-23 ENCOUNTER — Ambulatory Visit (HOSPITAL_COMMUNITY)
Admission: EM | Admit: 2022-07-23 | Discharge: 2022-07-23 | Disposition: A | Discharge status: Home / Self Care | Payer: No Typology Code available for payment source | Attending: Internal Medicine | Admitting: Internal Medicine

## 2022-07-23 ENCOUNTER — Encounter (HOSPITAL_COMMUNITY): Payer: Self-pay

## 2022-07-23 DIAGNOSIS — J0110 Acute frontal sinusitis, unspecified: Secondary | ICD-10-CM

## 2022-07-23 DIAGNOSIS — R051 Acute cough: Secondary | ICD-10-CM

## 2022-07-23 DIAGNOSIS — J209 Acute bronchitis, unspecified: Secondary | ICD-10-CM

## 2022-07-23 DIAGNOSIS — R062 Wheezing: Secondary | ICD-10-CM

## 2022-07-23 MED ORDER — ALBUTEROL SULFATE HFA 108 (90 BASE) MCG/ACT IN AERS: 1.0000 | INHALATION_SPRAY | Freq: Four times a day (QID) | RESPIRATORY_TRACT | 0 refills | Status: AC | PRN

## 2022-07-23 MED ORDER — ALBUTEROL SULFATE HFA 108 (90 BASE) MCG/ACT IN AERS: 2.0000 | INHALATION_SPRAY | Freq: Once | RESPIRATORY_TRACT | Status: DC

## 2022-07-23 MED ORDER — BENZONATATE 100 MG PO CAPS: 100.0000 mg | ORAL_CAPSULE | Freq: Three times a day (TID) | ORAL | 0 refills | Status: AC

## 2022-07-23 MED ORDER — PREDNISONE 20 MG PO TABS: 40.0000 mg | ORAL_TABLET | Freq: Every day | ORAL | 0 refills | Status: AC

## 2022-07-23 MED ORDER — AMOXICILLIN-POT CLAVULANATE 875-125 MG PO TABS: 1.0000 | ORAL_TABLET | Freq: Two times a day (BID) | ORAL | 0 refills | Status: AC

## 2022-07-23 NOTE — Discharge Instructions (Signed)
You have an acute sinus infection as well as bronchitis which is caused by inflammation in the lungs. Take Augmentin antibiotic twice daily for the next 7 days (in the morning and in the evening) Take prednisone 40 mg once daily for the next 5 days with breakfast.  Your first dose of this medication may be when you leave urgent care.  Take prednisone with food to avoid stomach upset.  Avoid taking ibuprofen when taking prednisone as these are sister medications.  You may take Tylenol as needed for aches and pains.  You may take Tessalon Perles every 8 hours as needed for coughing.  You may use albuterol inhaler every 4-6 hours as needed for cough, shortness of breath, and wheeze.  If you develop any new or worsening symptoms or do not improve in the next 2 to 3 days, please return.  If your symptoms are severe, please go to the emergency room.  Follow-up with your primary care provider for further evaluation and management of your symptoms as well as ongoing wellness visits.  I hope you feel better!

## 2022-07-23 NOTE — ED Provider Notes (Signed)
[facility]    CSN: [id number] Arrival date & time: 07/23/22  0945      History   Chief Complaint Chief Complaint  Patient presents with   Cough   Sore Throat   Headache   Back Pain    HPI Amber Krause is a 41 y.o. female.   Patient presents urgent care for evaluation of cough, nasal congestion, and generalized fatigue for the last month.  Symptoms improved initially after 7 to 10 days, then worsened again 2 weeks ago.  Patient's son is 72 years old, attends daycare, and was sick with similar symptoms but has improved.  She is experiencing chills but no fever at home.  No nausea, vomiting, dizziness, abdominal pain, diarrhea, or chest pain.  She experiences intermittent shortness of breath mostly after coughing fits at nighttime.  Denies history of asthma or chronic respiratory problems.  She is not a smoker and denies drug use.  Recent antibiotic use or steroid use.  Reports headache to the nasal bridge and the frontal sinus area.  Nasal congestion is thick and yellow/green. She has been taking tylenol as needed for aches, pains and fever/chills.  No other over-the-counter medications prior to arrival urgent care for symptoms use.   Cough Associated symptoms: headaches   Sore Throat Associated symptoms include headaches.  Headache Associated symptoms: back pain and cough   Back Pain Associated symptoms: headaches     History reviewed. No pertinent past medical history.  There are no problems to display for this patient.   Past Surgical History:  Procedure Laterality Date   APPENDECTOMY      OB History     Gravida  4   Para  2   Term  2   Preterm      AB  1   Living  2      SAB  1   IAB      Ectopic      Multiple      Live Births  2            Home Medications    Prior to Admission medications   Medication Sig Start Date End Date Taking? Authorizing Provider  albuterol (VENTOLIN HFA) 108 (90 Base) MCG/ACT inhaler Inhale 1-2  puffs into the lungs every 6 (six) hours as needed for wheezing or shortness of breath. 07/23/22  Yes Carlisle Beers, FNP  amoxicillin-clavulanate (AUGMENTIN) 875-125 MG tablet Take 1 tablet by mouth every 12 (twelve) hours. 07/23/22  Yes Carlisle Beers, FNP  benzonatate (TESSALON) 100 MG capsule Take 1 capsule (100 mg total) by mouth every 8 (eight) hours. 07/23/22  Yes Carlisle Beers, FNP  predniSONE (DELTASONE) 20 MG tablet Take 2 tablets (40 mg total) by mouth daily for 5 days. 07/23/22 07/28/22 Yes StanhopeDonavan Burnet, FNP  levothyroxine (SYNTHROID) 125 MCG tablet Take by mouth. 12/25/18   [provider]  Prenatal Multivit-Min-Fe-FA (PRENATAL VITAMINS) 0.8 MG tablet Take 1 tablet by mouth daily. 09/09/17   Levie Heritage, DO    Family History Family History  Problem Relation Age of Onset   Breast cancer Neg Hx     Social History Social History   Tobacco Use   Smoking status: Never   Smokeless tobacco: Never  Vaping Use   Vaping Use: Never used  Substance Use Topics   Alcohol use: No   Drug use: Never     Allergies   Patient has no known allergies.   Review of  Systems Review of Systems  Respiratory:  Positive for cough.   Musculoskeletal:  Positive for back pain.  Neurological:  Positive for headaches.  Per HPI   Physical Exam Triage Vital Signs ED Triage Vitals  Enc Vitals Group     BP 07/23/22 1212 129/84     Pulse Rate 07/23/22 1212 68     Resp 07/23/22 1212 16     Temp 07/23/22 1212 98.1 F (36.7 C)     Temp Source 07/23/22 1212 Oral     SpO2 07/23/22 1212 98 %     Weight --      Height --      Head Circumference --      Peak Flow --      Pain Score 07/23/22 1211 8     Pain Loc --      Pain Edu? --      Excl. in GC? --    No data found.  Updated Vital Signs BP 129/84 (BP Location: Right Arm)   Pulse 68   Temp 98.1 F (36.7 C) (Oral)   Resp 16   LMP 07/21/2022   SpO2 98%   Visual Acuity Right Eye Distance:    Left Eye Distance:   Bilateral Distance:    Right Eye Near:   Left Eye Near:    Bilateral Near:     Physical Exam Vitals and nursing note reviewed.  Constitutional:      Appearance: She is ill-appearing. She is not toxic-appearing.  HENT:     Head: Normocephalic and atraumatic.     Right Ear: Hearing, tympanic membrane, ear canal and external ear normal.     Left Ear: Hearing, tympanic membrane, ear canal and external ear normal.     Nose: Congestion present.     Right Sinus: Frontal sinus tenderness present.     Left Sinus: Frontal sinus tenderness present.     Mouth/Throat:     Lips: Pink.     Mouth: Mucous membranes are moist.     Pharynx: Oropharynx is clear. Uvula midline. Posterior oropharyngeal erythema present.     Tonsils: No tonsillar exudate or tonsillar abscesses.     Comments: Small amount of clear postnasal drainage visualized to the posterior oropharynx.  Eyes:     General: Lids are normal. Vision grossly intact. Gaze aligned appropriately.        Right eye: No discharge.        Left eye: No discharge.     Extraocular Movements: Extraocular movements intact.     Conjunctiva/sclera: Conjunctivae normal.     Pupils: Pupils are equal, round, and reactive to light.  Cardiovascular:     Rate and Rhythm: Normal rate and regular rhythm.     Heart sounds: Normal heart sounds, S1 normal and S2 normal.  Pulmonary:     Effort: Pulmonary effort is normal. No respiratory distress.     Breath sounds: Normal air entry. Wheezing present.     Comments: Diffuse inspiratory and expiratory wheezing heard to auscultation of all lung fields.  No focal findings.  No rhonchi or rales.  No respiratory distress. Musculoskeletal:     Cervical back: Neck supple.  Lymphadenopathy:     Cervical: Cervical adenopathy present.  Skin:    General: Skin is warm and dry.     Capillary Refill: Capillary refill takes less than 2 seconds.     Findings: No rash.  Neurological:     General: No  focal deficit present.  Mental Status: She is alert and oriented to person, place, and time. Mental status is at baseline.     Cranial Nerves: No dysarthria or facial asymmetry.     Motor: No weakness.     Gait: Gait normal.  Psychiatric:        Mood and Affect: Mood normal.        Speech: Speech normal.        Behavior: Behavior normal.        Thought Content: Thought content normal.        Judgment: Judgment normal.      UC Treatments / Results  Labs (all labs ordered are listed, but only abnormal results are displayed) Labs Reviewed - No data to display  EKG   Radiology No results found.  Procedures Procedures (including critical care time)  Medications Ordered in UC Medications  albuterol (VENTOLIN HFA) 108 (90 Base) MCG/ACT inhaler 2 puff (has no administration in time range)    Initial Impression / Assessment and Plan / UC Course  I have reviewed the triage vital signs and the nursing notes.  Pertinent labs & imaging results that were available during my care of the patient were reviewed by me and considered in my medical decision making (see chart for details).   1.  Acute nonrecurrent frontal sinusitis and acute bronchitis Presentation is consistent with bacterial sinusitis and acute bronchitis.  Symptoms have been present for greater than 10 days, therefore we will cover with Augmentin twice daily for the next 7 days.  Prednisone burst 40 mg daily for the next 5 days sent to pharmacy.  Patient to avoid NSAID therapy while taking prednisone due to increased risk of GI bleeding.  Albuterol 1 to 2 puffs may be used every 4-6 hours, first dose of albuterol given in clinic.  Deferred viral testing today as this will not change her treatment plan.  May use Tylenol as needed for aches, pains, and fever/chills while using prednisone.  May use Tessalon Perles every 8 hours as needed for cough.  Deferred imaging based on stable cardiopulmonary exam findings and  hemodynamically stable vital signs.  She is not toxic appearing and does not appear dehydrated.  Tolerating fluids and food well.   Discussed physical exam and available lab work findings in clinic with patient.  Counseled patient regarding appropriate use of medications and potential side effects for all medications recommended or prescribed today. Discussed red flag signs and symptoms of worsening condition,when to call the PCP office, return to urgent care, and when to seek higher level of care in the emergency department. Patient verbalizes understanding and agreement with plan. All questions answered. Patient discharged in stable condition.    Final Clinical Impressions(s) / UC Diagnoses   Final diagnoses:  Acute non-recurrent frontal sinusitis  Acute bronchitis, unspecified organism  Wheeze  Acute cough     Discharge Instructions      You have an acute sinus infection as well as bronchitis which is caused by inflammation in the lungs. Take Augmentin antibiotic twice daily for the next 7 days (in the morning and in the evening) Take prednisone 40 mg once daily for the next 5 days with breakfast.  Your first dose of this medication may be when you leave urgent care.  Take prednisone with food to avoid stomach upset.  Avoid taking ibuprofen when taking prednisone as these are sister medications.  You may take Tylenol as needed for aches and pains.  You may take [employer] every  8 hours as needed for coughing.  You may use albuterol inhaler every 4-6 hours as needed for cough, shortness of breath, and wheeze.  If you develop any new or worsening symptoms or do not improve in the next 2 to 3 days, please return.  If your symptoms are severe, please go to the emergency room.  Follow-up with your primary care provider for further evaluation and management of your symptoms as well as ongoing wellness visits.  I hope you feel better!    ED Prescriptions     Medication Sig  Dispense Auth. Provider   predniSONE (DELTASONE) 20 MG tablet Take 2 tablets (40 mg total) by mouth daily for 5 days. 10 tablet Carlisle BeersStanhope, Quinlin Conant M, FNP   albuterol (VENTOLIN HFA) 108 (90 Base) MCG/ACT inhaler Inhale 1-2 puffs into the lungs every 6 (six) hours as needed for wheezing or shortness of breath. 8 g Reita MayStanhope, Meyah Corle M, FNP   amoxicillin-clavulanate (AUGMENTIN) 875-125 MG tablet Take 1 tablet by mouth every 12 (twelve) hours. 14 tablet Reita MayStanhope, Ranier Coach M, FNP   benzonatate (TESSALON) 100 MG capsule Take 1 capsule (100 mg total) by mouth every 8 (eight) hours. 21 capsule Carlisle BeersStanhope, Shaaron Golliday M, FNP      PDMP not reviewed this encounter.   Carlisle BeersStanhope, Bonne Whack M, [place] 07/23/22 1310

## 2022-07-23 NOTE — ED Triage Notes (Signed)
Pt is here for cough, headache , chills, body aches , sore throat, and back pain x 60month

## 2022-07-23 NOTE — ED Notes (Signed)
PT left before med was given

## 2023-03-13 ENCOUNTER — Other Ambulatory Visit: Payer: Self-pay | Admitting: Obstetrics and Gynecology

## 2023-03-13 DIAGNOSIS — Z1231 Encounter for screening mammogram for malignant neoplasm of breast: Secondary | ICD-10-CM

## 2023-04-25 ENCOUNTER — Ambulatory Visit: Payer: Self-pay

## 2023-07-15 ENCOUNTER — Other Ambulatory Visit: Payer: Self-pay

## 2023-07-15 ENCOUNTER — Encounter (HOSPITAL_COMMUNITY): Payer: Self-pay

## 2023-07-15 ENCOUNTER — Emergency Department (HOSPITAL_COMMUNITY)
Admission: EM | Admit: 2023-07-15 | Discharge: 2023-07-16 | Disposition: A | Discharge status: Home / Self Care | Payer: Self-pay | Attending: Emergency Medicine | Admitting: Emergency Medicine

## 2023-07-15 DIAGNOSIS — G43801 Other migraine, not intractable, with status migrainosus: Secondary | ICD-10-CM

## 2023-07-15 DIAGNOSIS — R791 Abnormal coagulation profile: Secondary | ICD-10-CM | POA: Insufficient documentation

## 2023-07-15 DIAGNOSIS — R42 Dizziness and giddiness: Secondary | ICD-10-CM | POA: Insufficient documentation

## 2023-07-15 DIAGNOSIS — Z20822 Contact with and (suspected) exposure to covid-19: Secondary | ICD-10-CM | POA: Insufficient documentation

## 2023-07-15 DIAGNOSIS — R519 Headache, unspecified: Secondary | ICD-10-CM | POA: Insufficient documentation

## 2023-07-15 LAB — CBC WITH DIFFERENTIAL/PLATELET
Abs Immature Granulocytes: 0.02 10*3/uL (ref 0.00–0.07)
Basophils Absolute: 0 10*3/uL (ref 0.0–0.1)
Basophils Relative: 1 %
Eosinophils Absolute: 0.1 10*3/uL (ref 0.0–0.5)
Eosinophils Relative: 3 %
HCT: 33.6 % — ABNORMAL LOW (ref 36.0–46.0)
Hemoglobin: 10.5 g/dL — ABNORMAL LOW (ref 12.0–15.0)
Immature Granulocytes: 0 %
Lymphocytes Relative: 33 %
Lymphs Abs: 1.9 10*3/uL (ref 0.7–4.0)
MCH: 24 pg — ABNORMAL LOW (ref 26.0–34.0)
MCHC: 31.3 g/dL (ref 30.0–36.0)
MCV: 76.9 fL — ABNORMAL LOW (ref 80.0–100.0)
Monocytes Absolute: 0.6 10*3/uL (ref 0.1–1.0)
Monocytes Relative: 11 %
Neutro Abs: 2.9 10*3/uL (ref 1.7–7.7)
Neutrophils Relative %: 52 %
Platelets: 243 10*3/uL (ref 150–400)
RBC: 4.37 MIL/uL (ref 3.87–5.11)
RDW: 15.9 % — ABNORMAL HIGH (ref 11.5–15.5)
WBC: 5.6 10*3/uL (ref 4.0–10.5)
nRBC: 0 % (ref 0.0–0.2)

## 2023-07-15 LAB — BASIC METABOLIC PANEL
Anion gap: 7 (ref 5–15)
BUN: 15 mg/dL (ref 6–20)
CO2: 26 mmol/L (ref 22–32)
Calcium: 9.1 mg/dL (ref 8.9–10.3)
Chloride: 106 mmol/L (ref 98–111)
Creatinine, Ser: 1.28 mg/dL — ABNORMAL HIGH (ref 0.44–1.00)
GFR, Estimated: 53 mL/min — ABNORMAL LOW (ref >60)
Glucose, Bld: 84 mg/dL (ref 70–99)
Potassium: 3.7 mmol/L (ref 3.5–5.1)
Sodium: 139 mmol/L (ref 135–145)

## 2023-07-15 MED ORDER — ACETAMINOPHEN 325 MG PO TABS
650.0000 mg | ORAL_TABLET | Freq: Once | ORAL | Status: AC
  Administered 2023-07-15: 650 mg via ORAL
  Filled 2023-07-15: qty 2

## 2023-07-15 NOTE — ED Provider Triage Note (Signed)
Emergency Medicine Provider Triage Evaluation Note  Amber Krause , a 42 y.o. female  was evaluated in triage.  Pt complains of pain to the left side of her head since this morning as well as pain from the left side of her neck down to the elbow. Her face feels heavy and not like prior migraines. No N/V or abdominal pain.  Review of Systems  Positive: Left sided  Negative: Back pain  Physical Exam  BP 133/76   Pulse 61   Temp 98.3 F (36.8 C) (Oral)   Resp 16   Ht 5' 2.99" (1.6 m)   Wt 57.2 kg   LMP  (LMP Unknown)   SpO2 100%   BMI 22.33 kg/m  Gen:   Awake, no distress   Resp:  Normal effort  MSK:   Moves extremities without difficulty  Other:  No facial droop, CN  Medical Decision Making  Medically screening exam initiated at 9:37 PM.  Appropriate orders placed.  Amber Krause was informed that the remainder of the evaluation will be completed by another provider, this initial triage assessment does not replace that evaluation, and the importance of remaining in the ED until their evaluation is complete.   Maxwell Marion, PA-C 07/15/23 2145

## 2023-07-15 NOTE — ED Notes (Signed)
ED Provider at bedside. 

## 2023-07-15 NOTE — ED Triage Notes (Addendum)
[place] interpreter used for triage.   Describes left sided pain in left cheek, neck and arm x 1 week.   Today~ 5/6PM face began feeling heavy and become dizzy with positional changes.   Ambulatory to triage. NIH-0.

## 2023-07-16 ENCOUNTER — Emergency Department (HOSPITAL_COMMUNITY): Payer: Self-pay

## 2023-07-16 LAB — RESP PANEL BY RT-PCR (RSV, FLU A&B, COVID)  RVPGX2
Influenza A by PCR: NEGATIVE
Influenza B by PCR: NEGATIVE
Resp Syncytial Virus by PCR: NEGATIVE
SARS Coronavirus 2 by RT PCR: NEGATIVE

## 2023-07-16 LAB — HEPATIC FUNCTION PANEL
ALT: 13 U/L (ref 0–44)
AST: 20 U/L (ref 15–41)
Albumin: 3.4 g/dL — ABNORMAL LOW (ref 3.5–5.0)
Alkaline Phosphatase: 42 U/L (ref 38–126)
Bilirubin, Direct: <0.1 mg/dL (ref 0.0–0.2)
Total Bilirubin: 0.7 mg/dL (ref <1.2)
Total Protein: 6.3 g/dL — ABNORMAL LOW (ref 6.5–8.1)

## 2023-07-16 LAB — PROTIME-INR
INR: 1 (ref 0.8–1.2)
Prothrombin Time: 13.6 s (ref 11.4–15.2)

## 2023-07-16 LAB — HCG, SERUM, QUALITATIVE: Preg, Serum: NEGATIVE

## 2023-07-16 MED ORDER — SUMATRIPTAN SUCCINATE 50 MG PO TABS: 50.0000 mg | ORAL_TABLET | Freq: Once | ORAL | 2 refills | Status: AC | PRN

## 2023-07-16 MED ORDER — GOODSENSE PRENATAL VITAMINS 28-0.8 MG PO TABS: ORAL_TABLET | ORAL | 2 refills | Status: AC

## 2023-07-16 MED ORDER — PROCHLORPERAZINE MALEATE 5 MG PO TABS
10.0000 mg | ORAL_TABLET | Freq: Once | ORAL | Status: AC
  Administered 2023-07-16: 10 mg via ORAL
  Filled 2023-07-16: qty 2

## 2023-07-16 MED ORDER — IBUPROFEN 200 MG PO TABS
600.0000 mg | ORAL_TABLET | Freq: Once | ORAL | Status: AC
  Administered 2023-07-16: 600 mg via ORAL
  Filled 2023-07-16: qty 1

## 2023-07-16 NOTE — ED Notes (Signed)
Patient transported to MRI 

## 2023-07-16 NOTE — ED Notes (Signed)
Pt ambulated to RM 32 from WR w/steady gait observe, NAD noted, A&O x4.

## 2023-07-16 NOTE — ED Provider Notes (Signed)
Patient's care assumed at 6:30 AM from previous provider.  Patient is awaiting MRI for evaluation of headache and numbness to the left side of her face which started around 5 PM yesterday.   1. No acute intracranial abnormality or mass.  2. Scattered foci of T2 hyperintensity in the cerebral white matter  with frontal predominance, nonspecific but can be seen in the  setting of chronic migraine headaches and small vessel disease.      Electronically Signed   MRI shows no acute findings.  Counseled on results.  She is advised to follow-up with primary care. Patient is interested in trying a migraine medicine.  Patient is given a prescription for Imitrex.  She is advised to follow-up with her primary care physician for recheck.   Elson Areas, [place] 07/16/23 1255    Jacalyn Lefevre, MD 07/22/23 [phone]

## 2023-07-16 NOTE — ED Notes (Signed)
Patient transported to CT 

## 2023-07-16 NOTE — ED Notes (Signed)
Patient still awaiting MRI due to emergent MRIs trumping patient. Clydie Braun PA notified

## 2023-07-16 NOTE — ED Notes (Signed)
Patient back from MRI.

## 2023-07-16 NOTE — ED Provider Notes (Signed)
[facility] Provider Note   CSN: [id number] Arrival date & time: 07/15/23  2018     History  Chief Complaint  Patient presents with   Facial Pain   Patient offered [place] interpreter, declined stating she prefers to have interview in [place]. Amber Krause is a 42 y.o. female with history of intermittent headaches who presents with concern for left-sided muscular soreness in the shoulders.  Additionally this evening around 5 PM states that her left side of her face began to feel heavy and numb.  Some lightheadedness/dizziness with change of position.  No numbness weakness or tingling in the upper or lower extremities.  No difficulty speaking, no facial droop.  No history of same.  No history of malignancy, hormone placement therapy, or blood clot.  Symptoms improved at this time but not resolved.  Mild headache as well posteriorly.  HPI     Home Medications Prior to Admission medications   Medication Sig Start Date End Date Taking? Authorizing Provider  albuterol (VENTOLIN HFA) 108 (90 Base) MCG/ACT inhaler Inhale 1-2 puffs into the lungs every 6 (six) hours as needed for wheezing or shortness of breath. 07/23/22   Carlisle Beers, FNP  amoxicillin-clavulanate (AUGMENTIN) 875-125 MG tablet Take 1 tablet by mouth every 12 (twelve) hours. 07/23/22   Carlisle Beers, FNP  benzonatate (TESSALON) 100 MG capsule Take 1 capsule (100 mg total) by mouth every 8 (eight) hours. 07/23/22   Carlisle Beers, FNP  levothyroxine (SYNTHROID) 125 MCG tablet Take by mouth. 12/25/18   [provider]  Prenatal Multivit-Min-Fe-FA (PRENATAL VITAMINS) 0.8 MG tablet Take 1 tablet by mouth daily. 09/09/17   Levie Heritage, DO      Allergies    Patient has no known allergies.    Review of Systems   Review of Systems  Eyes:  Negative for photophobia, redness and visual disturbance.  Cardiovascular: Negative.   Genitourinary: Negative.    Neurological:  Positive for dizziness, numbness and headaches. Negative for tremors, seizures, syncope, facial asymmetry, speech difficulty, weakness and light-headedness.    Physical Exam Updated Vital Signs BP 123/60   Pulse 62   Temp 98.2 F (36.8 C) (Oral)   Resp 16   Ht 5' 2.99" (1.6 m)   Wt 57.2 kg   LMP  (LMP Unknown)   SpO2 100%   BMI 22.33 kg/m  Physical Exam Vitals and nursing note reviewed.  Constitutional:      Appearance: She is not ill-appearing or toxic-appearing.  HENT:     Head: Normocephalic and atraumatic.     Mouth/Throat:     Mouth: Mucous membranes are moist.     Pharynx: No oropharyngeal exudate or posterior oropharyngeal erythema.  Eyes:     General:        Right eye: No discharge.        Left eye: No discharge.     Conjunctiva/sclera: Conjunctivae normal.  Cardiovascular:     Rate and Rhythm: Normal rate and regular rhythm.     Pulses: Normal pulses.     Heart sounds: Normal heart sounds. No murmur heard.    No gallop.  Pulmonary:     Effort: Pulmonary effort is normal. No respiratory distress.     Breath sounds: Normal breath sounds. No wheezing or rales.  Abdominal:     General: Bowel sounds are normal. There is no distension.     Palpations: Abdomen is soft.     Tenderness: There is  no abdominal tenderness. There is no guarding or rebound.  Musculoskeletal:        General: No deformity.     Cervical back: Neck supple.     Right lower leg: No edema.     Left lower leg: No edema.  Skin:    General: Skin is warm and dry.     Capillary Refill: Capillary refill takes less than 2 seconds.  Neurological:     General: No focal deficit present.     Mental Status: She is alert and oriented to person, place, and time. Mental status is at baseline.     GCS: GCS eye subscore is 4. GCS verbal subscore is 5. GCS motor subscore is 6.     Cranial Nerves: Cranial nerves 2-12 are intact.     Sensory: Sensory deficit present.     Motor: Motor function  is intact.     Coordination: Coordination is intact.     Gait: Gait is intact.     Comments: Decreased sensation in the left side of the face.  Psychiatric:        Mood and Affect: Mood normal.     ED Results / Procedures / Treatments   Labs (all labs ordered are listed, but only abnormal results are displayed) Labs Reviewed  BASIC METABOLIC PANEL - Abnormal; Notable for the following components:      Result Value   Creatinine, Ser 1.28 (*)    GFR, Estimated 53 (*)    All other components within normal limits  CBC WITH DIFFERENTIAL/PLATELET - Abnormal; Notable for the following components:   Hemoglobin 10.5 (*)    HCT 33.6 (*)    MCV 76.9 (*)    MCH 24.0 (*)    RDW 15.9 (*)    All other components within normal limits  HEPATIC FUNCTION PANEL - Abnormal; Notable for the following components:   Total Protein 6.3 (*)    Albumin 3.4 (*)    All other components within normal limits  RESP PANEL BY RT-PCR (RSV, FLU A&B, COVID)  RVPGX2  PROTIME-INR  HCG, SERUM, QUALITATIVE    EKG None  Radiology CT HEAD WO CONTRAST ( )  Result Date: 07/16/2023 CLINICAL DATA:  Neuro deficit with acute stroke suspected. Left-sided facial numbness EXAM: CT HEAD WITHOUT CONTRAST TECHNIQUE: Contiguous axial images were obtained from the base of the skull through the vertex without intravenous contrast. RADIATION DOSE REDUCTION: This exam was performed according to the departmental dose-optimization program which includes automated exposure control, adjustment of the mA and/or kV according to patient size and/or use of iterative reconstruction technique. COMPARISON:  None Available. FINDINGS: Brain: No evidence of acute infarction, hemorrhage, hydrocephalus, extra-axial collection or mass lesion/mass effect. Vascular: No hyperdense vessel or unexpected calcification. Skull: Normal. Negative for fracture or focal lesion. Sinuses/Orbits: No acute finding. IMPRESSION: Negative head CT. Electronically Signed    By: Tiburcio Pea M.D.   On: 07/16/2023 04:49    Procedures Procedures    Medications Ordered in ED Medications  acetaminophen (TYLENOL) tablet 650 mg (650 mg Oral Given 07/15/23 2147)  prochlorperazine (COMPAZINE) tablet 10 mg (10 mg Oral Given 07/16/23 0433)  ibuprofen (ADVIL) tablet 600 mg (600 mg Oral Given 07/16/23 7829)    ED Course/ Medical Decision Making/ A&P                                 Medical Decision Making 42 year old left-sided facial numbness.  Normal vitals on intake, cardiopulmonary unremarkable, abdominal exam is benign.  Neuroexam unremarkable with the exception of left-sided facial numbness.  On exam limited to migraine, CVA, demyelinating disease.   Amount and/or Complexity of Data Reviewed Labs: ordered.    Details: CBC without leukocytosis, evaluate for abdominal pain 0.5.  CMP with creatinine of 1.28.  RVP negative, INR normal, pregnancy is negative, hepatic function negative. Radiology: ordered.    Details:   CT head unremarkable.  Obvious productive  Risk Prescription drug management.   HA resolved after migraine medications, no change of facial numbness. Care of this patient signed out to oncoming ED provider sofia, PA-C at time of shift change. All pertinent HPI, physical exam, and laboratory findings were discussed with them prior to my departure. Disposition of patient pending completion of workup, reevaluation, and clinical judgement of oncoming ED provider. Pending MRI.   This chart was dictated using voice recognition software, Dragon. Despite the best efforts of this provider to proofread and correct errors, errors may still occur which can change documentation meaning.         Final Clinical Impression(s) / ED Diagnoses Final diagnoses:  None    Rx / DC Orders ED Discharge Orders     None         Sherrilee Gilles 07/16/23 0656    Glynn Octave, MD 07/16/23 [phone]
